# Patient Record
Sex: Male | Born: 1990 | Race: White | Hispanic: No | Marital: Married | Smoking: Never smoker
Health system: Southern US, Community
[De-identification: ages and names within clinical notes are randomized; demographics above are authoritative.]

---

## 2016-12-09 ENCOUNTER — Emergency Department (HOSPITAL_COMMUNITY): Payer: Self-pay

## 2016-12-09 ENCOUNTER — Emergency Department (HOSPITAL_COMMUNITY)
Admission: EM | Admit: 2016-12-09 | Discharge: 2016-12-09 | Disposition: A | Discharge status: Home / Self Care | Payer: Self-pay | Attending: Emergency Medicine | Admitting: Emergency Medicine

## 2016-12-09 ENCOUNTER — Encounter (HOSPITAL_COMMUNITY): Payer: Self-pay | Admitting: *Deleted

## 2016-12-09 DIAGNOSIS — Z5321 Procedure and treatment not carried out due to patient leaving prior to being seen by health care provider: Secondary | ICD-10-CM | POA: Insufficient documentation

## 2016-12-09 DIAGNOSIS — R079 Chest pain, unspecified: Secondary | ICD-10-CM | POA: Insufficient documentation

## 2016-12-09 LAB — BASIC METABOLIC PANEL
Anion gap: 13 (ref 5–15)
BUN: 13 mg/dL (ref 6–20)
CHLORIDE: 102 mmol/L (ref 101–111)
CO2: 23 mmol/L (ref 22–32)
CREATININE: 1.15 mg/dL (ref 0.61–1.24)
Calcium: 10.4 mg/dL — ABNORMAL HIGH (ref 8.9–10.3)
GFR calc Af Amer: >60 mL/min (ref >60)
GFR calc non Af Amer: >60 mL/min (ref >60)
Glucose, Bld: 107 mg/dL — ABNORMAL HIGH (ref 65–99)
Potassium: 3.8 mmol/L (ref 3.5–5.1)
SODIUM: 138 mmol/L (ref 135–145)

## 2016-12-09 LAB — I-STAT TROPONIN, ED: Troponin i, poc: 0 ng/mL (ref 0.00–0.08)

## 2016-12-09 LAB — CBC
HCT: 48.5 % (ref 39.0–52.0)
Hemoglobin: 17.7 g/dL — ABNORMAL HIGH (ref 13.0–17.0)
MCH: 31 pg (ref 26.0–34.0)
MCHC: 36.5 g/dL — AB (ref 30.0–36.0)
MCV: 84.9 fL (ref 78.0–100.0)
PLATELETS: 244 10*3/uL (ref 150–400)
RBC: 5.71 MIL/uL (ref 4.22–5.81)
RDW: 12.8 % (ref 11.5–15.5)
WBC: 8.4 10*3/uL (ref 4.0–10.5)

## 2016-12-09 NOTE — ED Notes (Signed)
Pt left without being seen after triage

## 2016-12-09 NOTE — ED Triage Notes (Signed)
Pt reports chest pressure and feeling like his heart is racing intermittently. Pt reports that this has been ongoing for several days. Pt states that he was seen at a [facility] in [place] yesterday for same.

## 2018-01-19 IMAGING — CR DG CHEST 2V
2 series · 2 of 2 positions shown · non-contrast
Comparison: None.

CLINICAL DATA: Two day history of chest tightness

EXAM:
CHEST  2 VIEW

[chest pa]
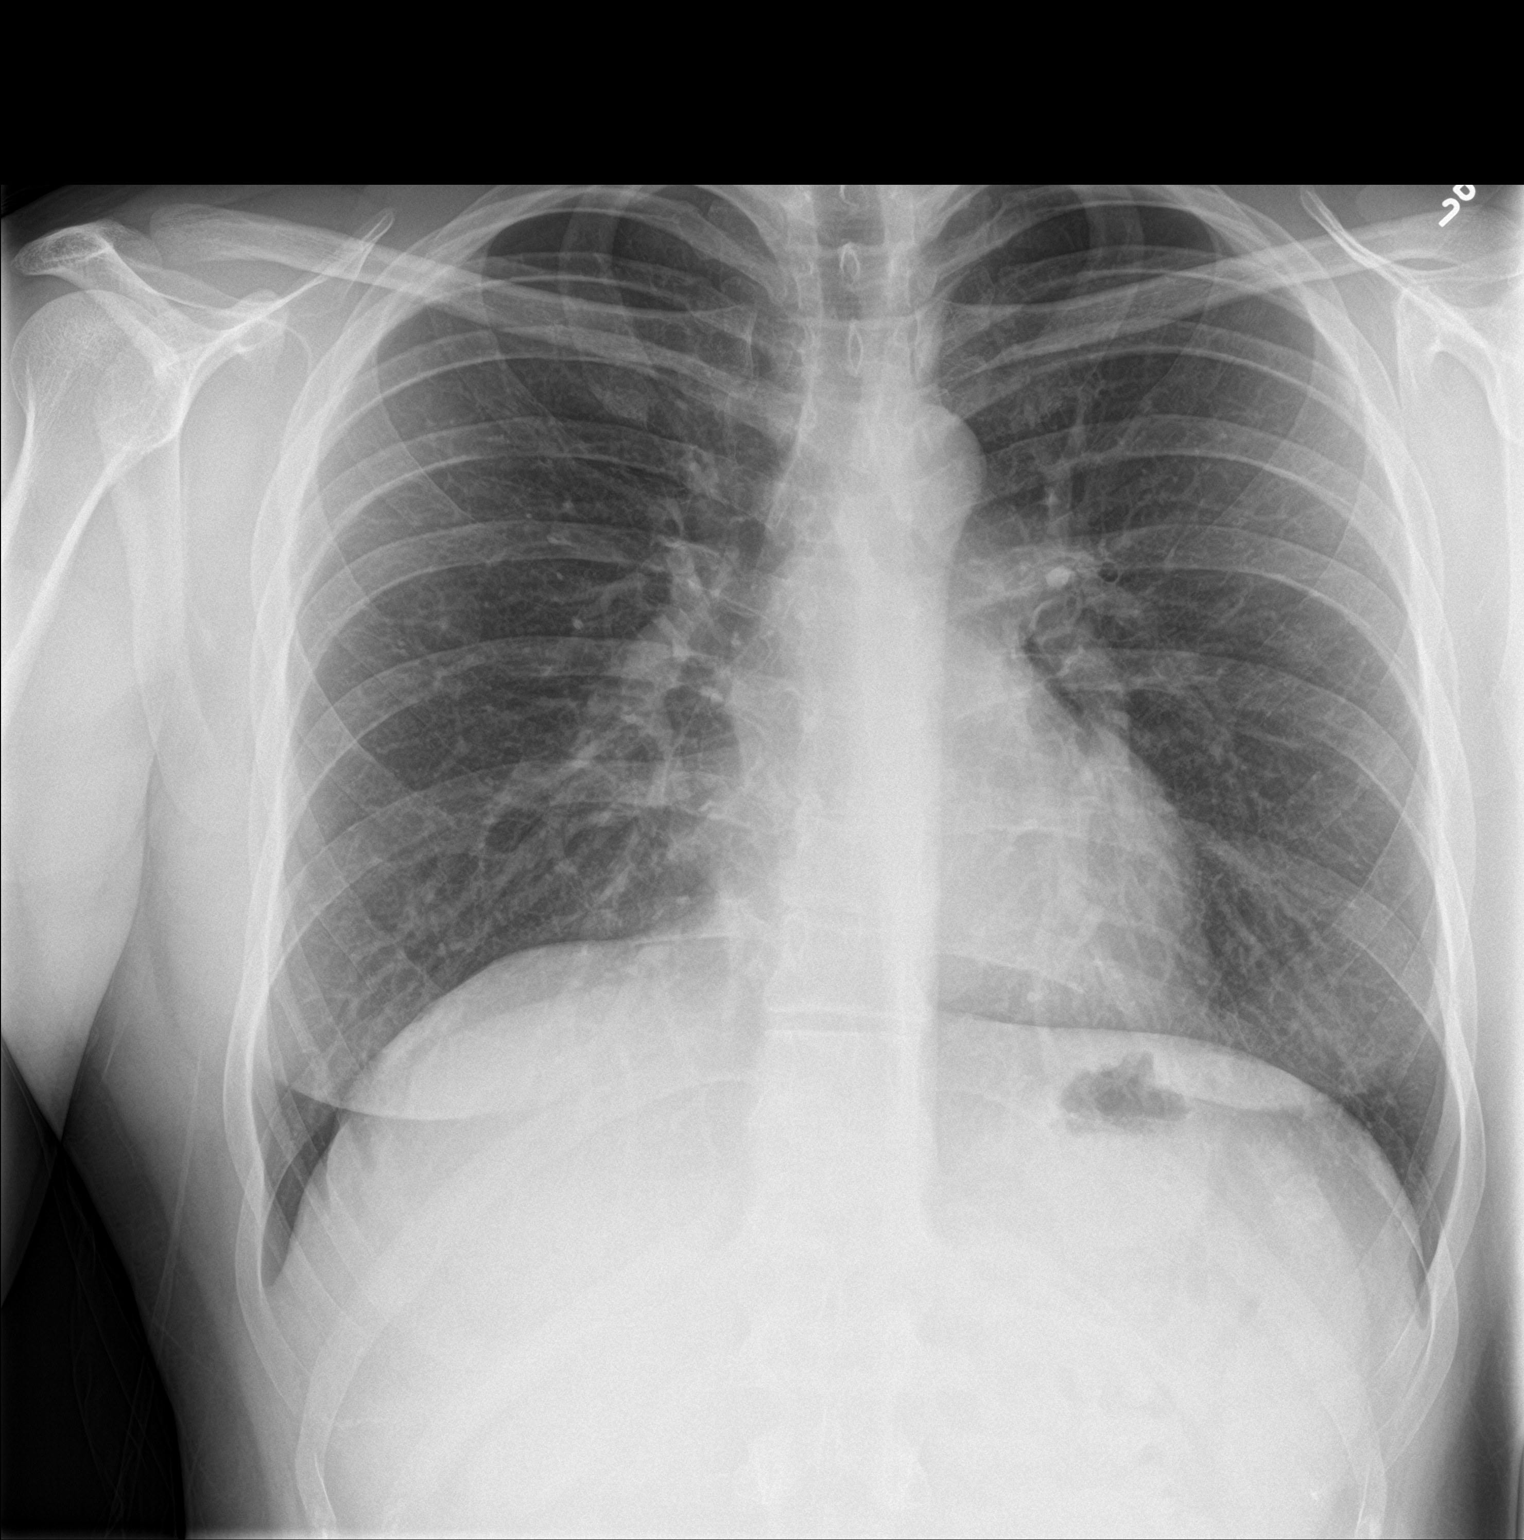

[chest lat]
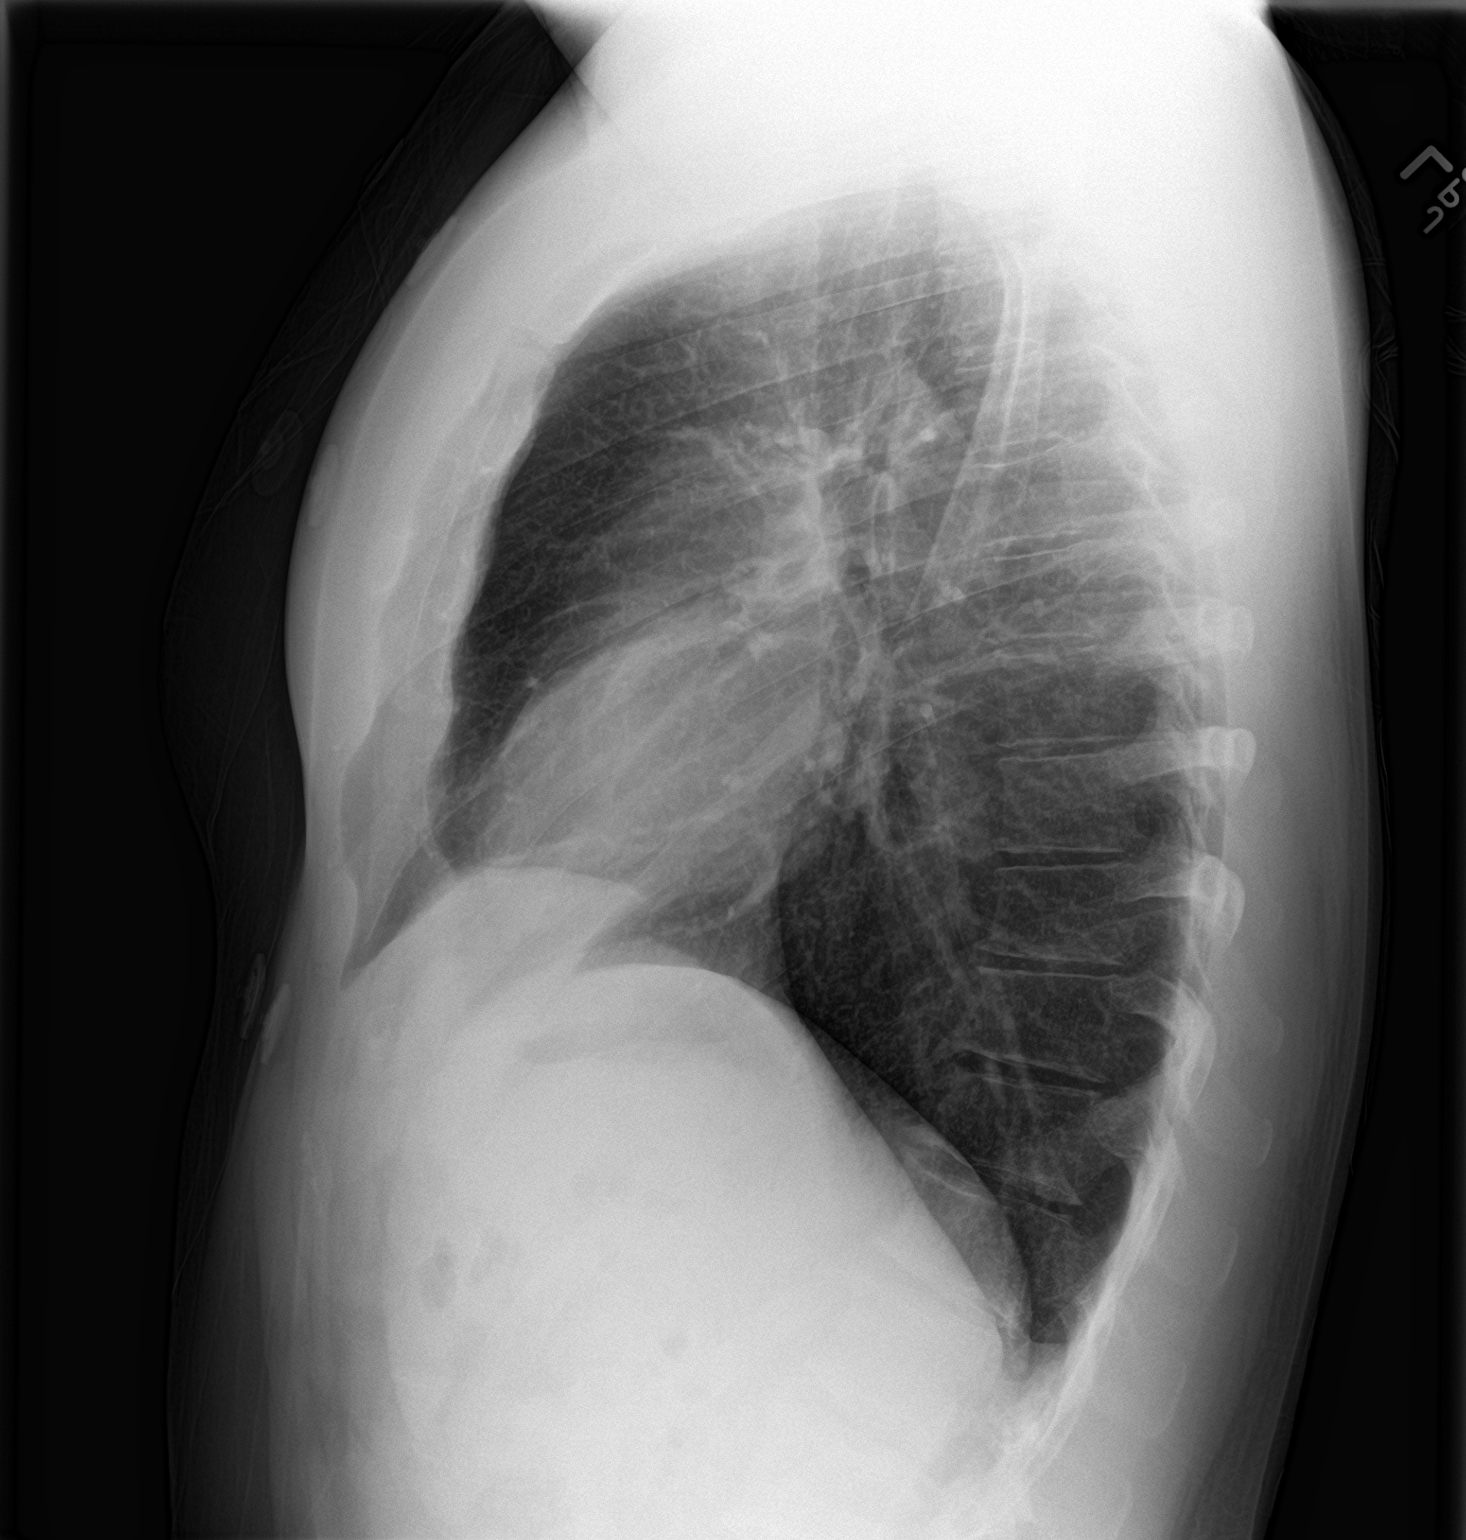

[2 of 2 positions shown; findings below may reference images not displayed]

FINDINGS: There is no edema or consolidation. The heart size and pulmonary
vascularity are normal. No adenopathy. No pneumothorax. No bone
lesions.
IMPRESSION: No edema or consolidation.
